# Patient Record
Sex: Male | Born: 2000 | Race: White | Hispanic: No | Marital: Single | Smoking: Never smoker
Health system: Southern US, Community
[De-identification: ages and names within clinical notes are randomized; demographics above are authoritative.]

## PROBLEM LIST (undated history)

## (undated) DIAGNOSIS — L719 Rosacea, unspecified: Secondary | ICD-10-CM

## (undated) HISTORY — DX: Rosacea, unspecified: L71.9

---
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
